# Patient Record
Sex: Male | Born: 1960 | Race: White | Hispanic: No | Marital: Married
Health system: Midwestern US, Academic
[De-identification: ages and names within clinical notes are randomized; demographics above are authoritative.]

---

## 2016-07-17 ENCOUNTER — Encounter: Admit: 2016-07-17 | Discharge: 2016-07-17 | Payer: Commercial Managed Care - HMO

## 2016-07-17 ENCOUNTER — Ambulatory Visit: Admit: 2016-07-17 | Discharge: 2016-07-17 | Payer: Commercial Managed Care - HMO

## 2016-07-17 DIAGNOSIS — I10 Essential (primary) hypertension: Principal | ICD-10-CM

## 2016-07-17 DIAGNOSIS — N486 Induration penis plastica: Principal | ICD-10-CM

## 2016-07-17 NOTE — Progress Notes
Date of Service: 07/17/2016     Subjective:             Matthew Beck is a 56 y.o. male who presents for evaluation for Peyronie's Disease.    History of Present Illness  Matthew Beck is 56 y.o. male who first noticed development of pain and curvature to his penis with associated soreness starting 7 months ago. First symptoms included: pain and curvature with a gradual onset.  Patient does recall trauma to the penis during intercourse with his wife but denies hearing a pop, losing his erection or develop swelling  Patient has not had prior treatment.  Patient is currently on no medications. Past medical history includes HTN.    Patient???s penis is described as the following:  Curvature worsened over time: Yes  Curvature stable: Yes  Curvature: Upwards   Degree of curvature: 45-50 degrees   Penis shortening: No  Hinge defect: No  Hour glass deformity:  Yes, at the base  Grade of erection: 9  Difficulty maintaining erection: NO  Capable of intercourse: Yes  Sexual preference: Male   Pain during sexual intercourse: No, but pain with erection  Partner pain during sexual intercourse: No  Difficulty with penetration: No   Emotional status changed: No   Libido: Normal, but low recently due to Peyronie's    Family history:  Dupuytren???s: No  Lederhose: No  Other scarring disorders: No    AUASS: 0; Bother 1    Past Medical History:   Diagnosis Date   ??? Hypertension        Past Surgical History:   Procedure Laterality Date   ??? STOMACH SURGERY  1962    Per pt had surgery as an infant unable to keep food down   ??? WRIST SURGERY Right 1994   ??? HERNIA REPAIR  70's and 90's   ??? KNEE SURGERY Left 90's       Family History   Problem Relation Age of Onset   ??? Hypertension Father        Current Outpatient Prescriptions   Medication Sig Dispense Refill   ??? cetirizine (ZYRTEC) 10 mg tablet Take 10 mg by mouth every morning.     ??? cholecalciferol (VITAMIN D-3) 1,000 units tablet Daily     ??? lisinopril (PRINIVIL; ZESTRIL) 20 mg tablet ??? Multivitamins with Fluoride (MULTI-VITAMIN PO) Take  by mouth.     ??? naproxen sodium (ALEVE PO) Take 220 mg by mouth.     ??? potassium        No current facility-administered medications for this visit.        No Known Allergies    Social History     Social History   ??? Marital status: Married     Spouse name: N/A   ??? Number of children: N/A   ??? Years of education: N/A     Occupational History   ??? Not on file.     Social History Main Topics   ??? Smoking status: Light Tobacco Smoker   ??? Smokeless tobacco: Never Used   ??? Alcohol use 12.0 oz/week     20 Cans of beer per week   ??? Drug use: No      Comment: last used in 1985   ??? Sexual activity: Yes     Partners: Female     Other Topics Concern   ??? Not on file     Social History Narrative   ??? No narrative on file  Review of Systems   Constitutional: Negative for activity change, appetite change, chills, diaphoresis, fatigue, fever and unexpected weight change.   HENT: Negative for congestion, hearing loss, mouth sores and sinus pressure.    Eyes: Negative for visual disturbance.   Respiratory: Negative for apnea, cough, chest tightness and shortness of breath.    Cardiovascular: Negative for chest pain, palpitations and leg swelling.   Gastrointestinal: Negative for abdominal pain, blood in stool, constipation, diarrhea, nausea, rectal pain and vomiting.   Endocrine: Negative.    Genitourinary: Negative for decreased urine volume, difficulty urinating, discharge, dysuria, enuresis, flank pain, frequency, genital sores, hematuria, penile pain, penile swelling, scrotal swelling, testicular pain and urgency.   Musculoskeletal: Negative for arthralgias, back pain, gait problem and myalgias.   Skin: Negative for rash and wound.   Allergic/Immunologic: Negative.    Neurological: Negative for dizziness, tremors, seizures, syncope, weakness, light-headedness, numbness and headaches.   Hematological: Negative for adenopathy. Does not bruise/bleed easily. Psychiatric/Behavioral: Negative for decreased concentration and dysphoric mood. The patient is not nervous/anxious.        Objective:         ??? cetirizine (ZYRTEC) 10 mg tablet Take 10 mg by mouth every morning.   ??? cholecalciferol (VITAMIN D-3) 1,000 units tablet Daily   ??? lisinopril (PRINIVIL; ZESTRIL) 20 mg tablet    ??? Multivitamins with Fluoride (MULTI-VITAMIN PO) Take  by mouth.   ??? naproxen sodium (ALEVE PO) Take 220 mg by mouth.   ??? potassium      Vitals:    07/17/16 1311   BP: 125/83   Pulse: 74   Weight: 85.5 kg (188 lb 6.4 oz)   Height: 177.8 cm (70)     Body mass index is 27.03 kg/m???.     Physical Exam   Constitutional: He is oriented to person, place, and time. He appears well-developed and well-nourished. No distress.   HENT:   Head: Normocephalic and atraumatic.   Nose: Nose normal.   Eyes: Conjunctivae and EOM are normal. Pupils are equal, round, and reactive to light. Right eye exhibits no discharge. Left eye exhibits no discharge. No scleral icterus.   Neck: Normal range of motion. No tracheal deviation present.   Cardiovascular: Normal rate and intact distal pulses.    Pulmonary/Chest: Effort normal and breath sounds normal. No stridor. No respiratory distress. He exhibits no tenderness.   Abdominal: Soft. Bowel sounds are normal. He exhibits no distension and no mass. There is no tenderness. There is no rebound and no guarding.   Genitourinary:   Genitourinary Comments: Circumcised phallus with orthotopic meatus without lesions. Palpable plaque noted at dorsal base of penis that is nontender.  Scrotal testicles palpable bilaterally. Symmetric without masses or swelling.   Musculoskeletal: Normal range of motion. He exhibits no edema or deformity.   Neurological: He is alert and oriented to person, place, and time.   Skin: Skin is warm and dry. He is not diaphoretic. No pallor.   Psychiatric: He has a normal mood and affect. His behavior is normal. Judgment and thought content normal. Assessment and Plan:    Problem   Peyronie's Disease    07/17/16: Referred by Dr. Larwance Rote. 45 degree dorsal curvature with palpable plaque. Injury during intercourse 7 months ago. No prior tx.       Peyronie's disease  56 y.o. y.o. male who presents for evaluation for Peyronie's Disease. He is in active phase disease given that he continues to have soreness with erections. I explained the etiology  and pathophysiology of Peyronie's disease with the patient. I discussed the difference between active phase and chronic phase disease. Treatment options were outlined with patient including: observation, intralesional therapy including verapamil injection, Xiaflex (collagenase) injections, penile traction therapy, penile plication, incision and grafting, and penile prosthesis insertion. Risks and benefits of each treatment modality were discussed. Recommendation was made for VED traction therapy and patient is agreeable.  - Will start VED traction therapy  - RTC in 6 months      Patient seen and discussed with Dr. Penelope Coop, who directed plan of care.    Minerva Ends, MD  PGY1 Urology       ATTESTATION    I personally saw and evaluated the patient and determined the plan of care.??? I personally performed the key portions of the E&M visit, discussed the case with the resident, and concur with documentation of history, physical examination, assessment, and treatment plan unless otherwise noted.      Liborio Nixon, MD    07/17/2016 2:17 PM

## 2016-11-28 ENCOUNTER — Encounter: Admit: 2016-11-28 | Discharge: 2016-11-28 | Payer: Commercial Managed Care - HMO

## 2017-03-13 ENCOUNTER — Encounter: Admit: 2017-03-13 | Discharge: 2017-03-13 | Payer: Commercial Managed Care - HMO

## 2017-03-13 ENCOUNTER — Ambulatory Visit: Admit: 2017-03-13 | Discharge: 2017-03-13 | Payer: Commercial Managed Care - HMO

## 2017-03-13 DIAGNOSIS — N486 Induration penis plastica: Principal | ICD-10-CM

## 2017-03-13 DIAGNOSIS — I10 Essential (primary) hypertension: Principal | ICD-10-CM

## 2017-03-17 ENCOUNTER — Encounter: Admit: 2017-03-17 | Discharge: 2017-03-17 | Payer: Commercial Managed Care - HMO

## 2018-08-01 IMAGING — CR LOW_EXM
3 series · 3 of 3 positions shown · non-contrast
Comparison: none

[knee ap]
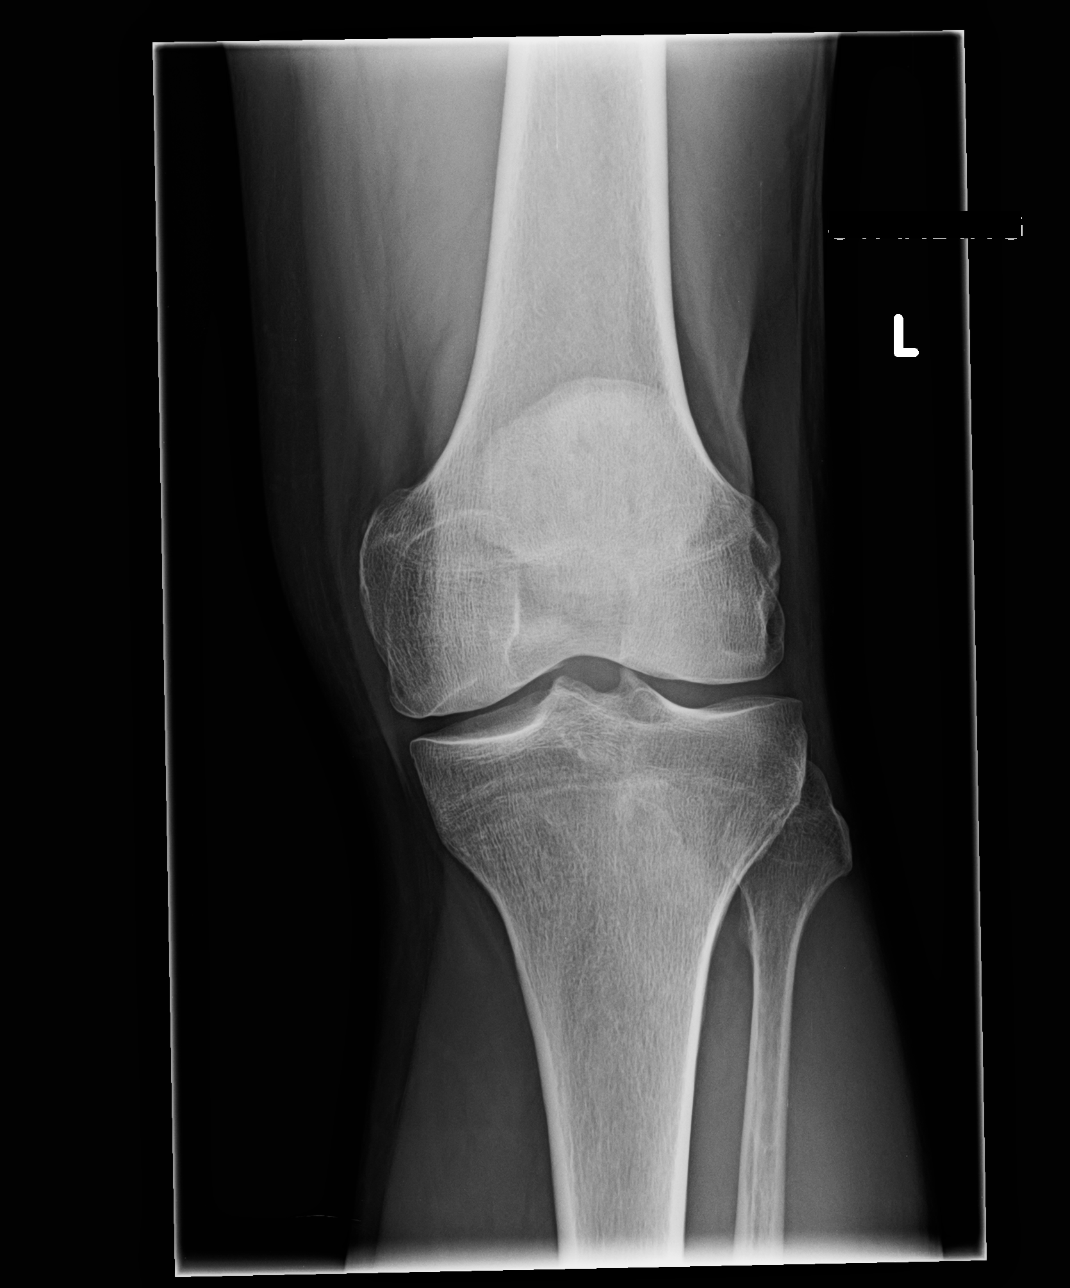

[knee lat]
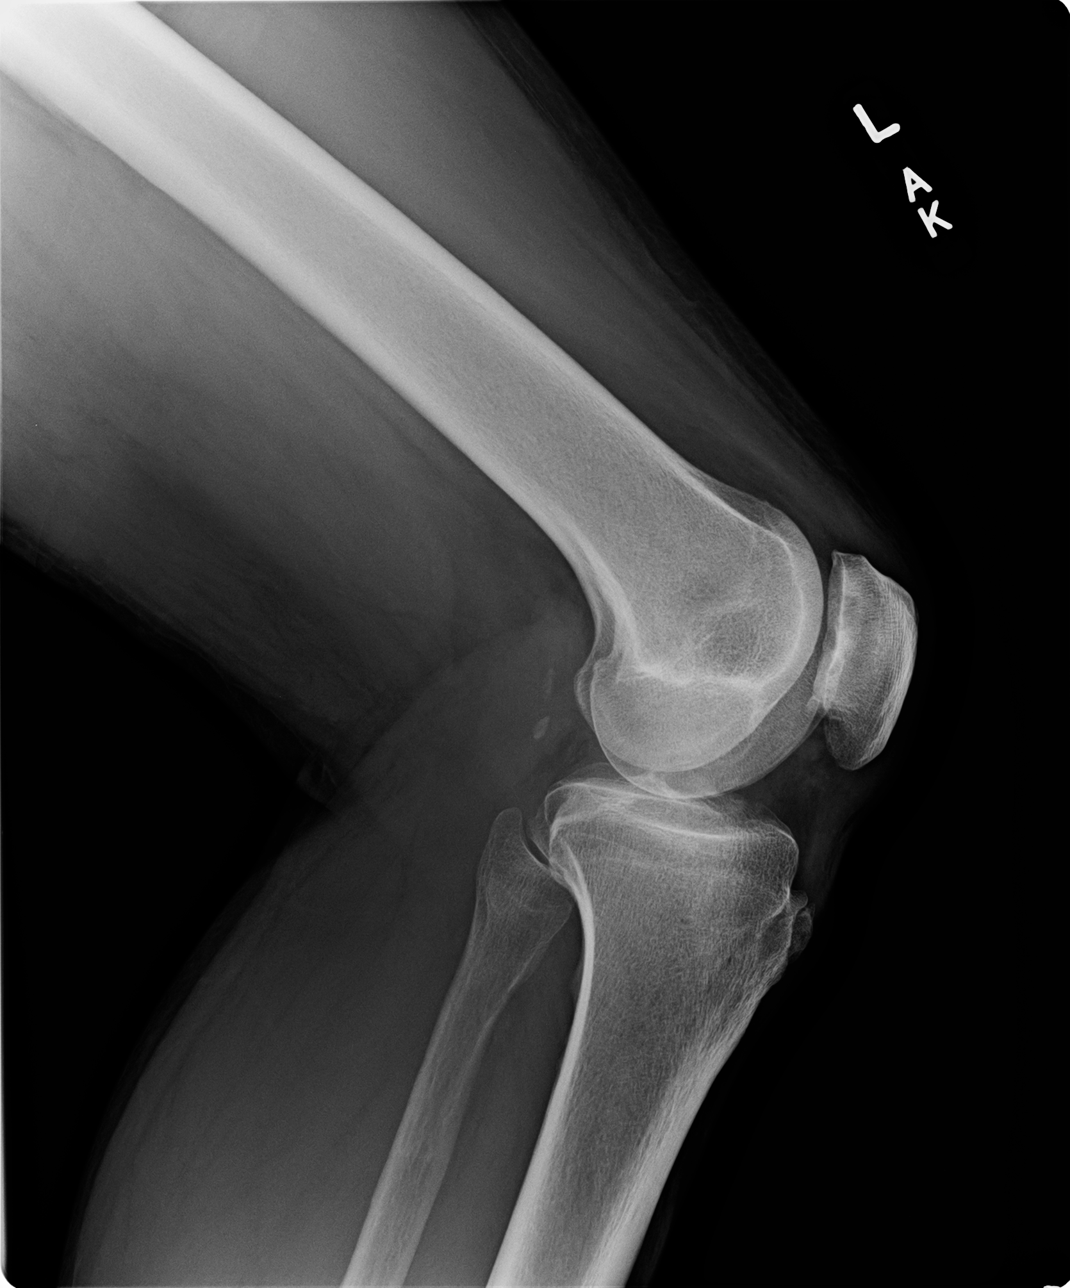

[knee sunrise]
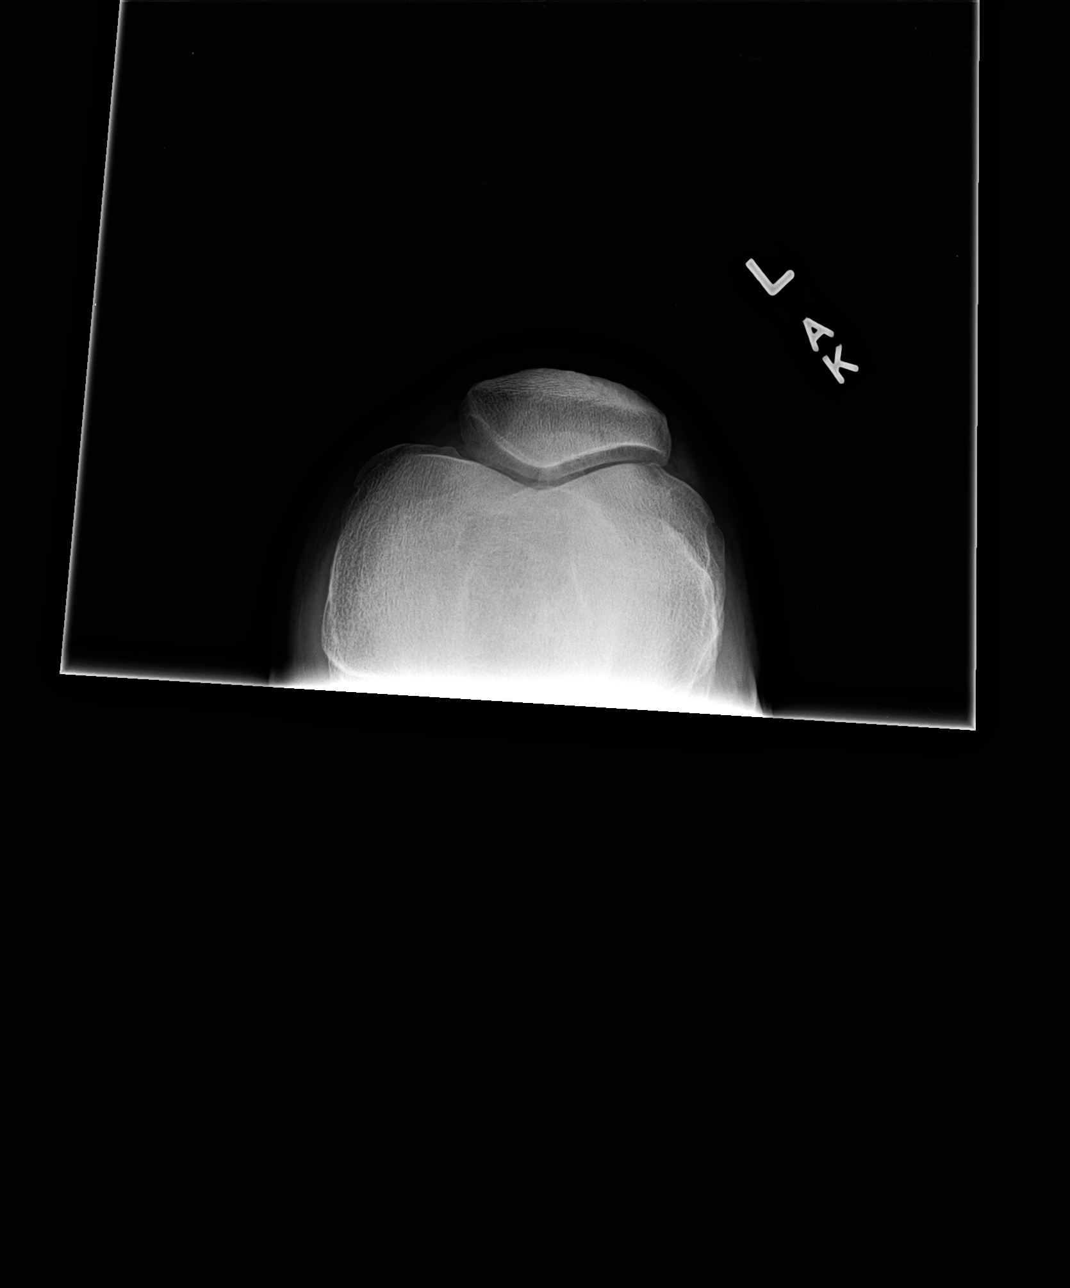

[3 of 3 positions shown; findings below may reference images not displayed]

DIAGNOSTIC STUDIES

EXAM

Left knee.

INDICATION

left knee pain
LEFT KNEE PAIN OFF AND ON X1 YEAR WORSENING LATELY ESPECIALLY WITH STAIRS -
HX OF SCOPE SX 20 YRS AGO - [place]

TECHNIQUE

AP standing, lateral, and sunrise left knee views.

COMPARISONS

None.

FINDINGS

Mild marginal osteophytic spurring of the anterior compartment with probable subchondral cystic
change of the patella. No joint effusion. The extensor mechanism of the knee is grossly intact.
Incomplete fusion of the tibial tuberosity.

IMPRESSION

Mild osteoarthropathy of the anterior compartment.
